# Patient Record
Sex: Male | Born: 1965 | Race: White | Hispanic: No | Smoking: Current every day smoker
Health system: Southern US, Community
[De-identification: ages and names within clinical notes are randomized; demographics above are authoritative.]

## PROBLEM LIST (undated history)

## (undated) HISTORY — PX: VASECTOMY: SHX75

---

## 2019-09-06 ENCOUNTER — Emergency Department
Admission: EM | Admit: 2019-09-06 | Discharge: 2019-09-06 | Disposition: A | Discharge status: Home / Self Care | Payer: Self-pay | Source: Home / Self Care

## 2019-09-06 ENCOUNTER — Other Ambulatory Visit: Payer: Self-pay

## 2019-09-06 ENCOUNTER — Encounter: Payer: Self-pay | Admitting: Emergency Medicine

## 2019-09-06 ENCOUNTER — Emergency Department (INDEPENDENT_AMBULATORY_CARE_PROVIDER_SITE_OTHER): Payer: Self-pay

## 2019-09-06 DIAGNOSIS — M898X1 Other specified disorders of bone, shoulder: Secondary | ICD-10-CM

## 2019-09-06 DIAGNOSIS — G8929 Other chronic pain: Secondary | ICD-10-CM

## 2019-09-06 DIAGNOSIS — M549 Dorsalgia, unspecified: Secondary | ICD-10-CM

## 2019-09-06 DIAGNOSIS — M25512 Pain in left shoulder: Secondary | ICD-10-CM

## 2019-09-06 NOTE — ED Triage Notes (Signed)
LT shoulder pain x 6 months, painful, denies injury, constant ache

## 2019-09-06 NOTE — ED Provider Notes (Signed)
[facility]    CSN: [id number] Arrival date & time: 09/06/19  4132      History   Chief Complaint Chief Complaint  Patient presents with  . Shoulder Pain    HPI Shane Dyer is a 53 y.o. male.   HPI  Shane Dyer is a 53 y.o. male presenting to UC with c/o gradually worsening pain in Left upper back, around shoulder blade for about 6 months. 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 Pain is aching and sore, sharp at times. Worse with movement.  He notes he did go to an urgent care and get muscle relaxers and pain medication when he initially had pain but no improvement since then.     History reviewed. No pertinent past medical history.  There are no active problems to display for this patient.   Past Surgical History:  Procedure Laterality Date  . VASECTOMY         Home Medications    Prior to Admission medications   Medication Sig Start Date End Date Taking? Authorizing Provider  acetaminophen (TYLENOL) 500 MG tablet Take 500 mg by mouth every 6 (six) hours as needed.   Yes [provider]  naproxen sodium (ALEVE) 220 MG tablet Take 220 mg by mouth.   Yes [provider]    Family History Family History  Problem Relation Age of Onset  . Heart attack Mother   . Emphysema Mother   . Emphysema Father     Social History Social History   Tobacco Use  . Smoking status: Current Every Day Smoker    Years: 40.00    Types: Cigarettes  . Smokeless tobacco: Never Used  Substance Use Topics  . Alcohol use: Yes  . Drug use: Not on file     Allergies   Penicillins   Review of Systems Review of Systems  Musculoskeletal: Positive for arthralgias, back pain and myalgias. Negative for neck pain and neck stiffness.  Skin: Negative for color change and wound.  Neurological: Negative for weakness and numbness.     Physical Exam Triage Vital Signs ED Triage Vitals  Enc Vitals Group     BP  09/06/19 0959 136/90     Pulse Rate 09/06/19 0959 (!) 110     Resp --      Temp 09/06/19 0959 97.9 F (36.6 C)     Temp Source 09/06/19 0959 Oral     SpO2 09/06/19 0959 98 %     Weight 09/06/19 1000 145 lb (65.8 kg)     Height 09/06/19 1000 6\' 2"  (1.88 m)     Head Circumference --      Peak Flow --      Pain Score 09/06/19 1000 9     Pain Loc --      Pain Edu? --      Excl. in Index? --    No data found.  Updated Vital Signs BP 136/90 (BP Location: Right Arm)   Pulse (!) 110   Temp 97.9 F (36.6 C) (Oral)   Ht 6\' 2"  (1.88 m)   Wt 145 lb (65.8 kg)   SpO2 98%   BMI 18.62 kg/m   Visual Acuity Right Eye Distance:   Left Eye Distance:   Bilateral Distance:    Right Eye Near:   Left Eye Near:    Bilateral Near:     Physical Exam Vitals signs and nursing note reviewed.  Constitutional:  Appearance: Normal appearance. He is well-developed.  HENT:     Head: Normocephalic and atraumatic.  Neck:     Musculoskeletal: Normal range of motion.  Cardiovascular:     Rate and Rhythm: Normal rate.     Pulses:          Radial pulses are 2+ on the left side.  Pulmonary:     Effort: Pulmonary effort is normal.  Musculoskeletal: Normal range of motion.        General: Tenderness present.       Back:  Skin:    General: Skin is warm and dry.     Capillary Refill: Capillary refill takes less than 2 seconds.  Neurological:     Mental Status: He is alert and oriented to person, place, and time.     Sensory: No sensory deficit.  Psychiatric:        Behavior: Behavior normal.      UC Treatments / Results  Labs (all labs ordered are listed, but only abnormal results are displayed) Labs Reviewed - No data to display  EKG   Radiology Dg Thoracic Spine W/swimmers  Result Date: 09/06/2019 CLINICAL DATA:  53 year old male with chronic left shoulder pain. EXAM: THORACIC SPINE - 3 VIEWS COMPARISON:  None. FINDINGS: There is no acute fracture or subluxation of the thoracic  spine. The vertebral body heights and disc spaces are maintained. The visualized posterior elements are intact. The soft tissues are unremarkable. IMPRESSION: Negative. Electronically Signed   By: Elgie Collard M.D.   On: 09/06/2019 10:36    Procedures Procedures (including critical care time)  Medications Ordered in UC Medications - No data to display  Initial Impression / Assessment and Plan / UC Course  I have reviewed the triage vital signs and the nursing notes.  Pertinent labs & imaging results that were available during my care of the patient were reviewed by me and considered in my medical decision making (see chart for details).     Tenderness along Left upper thoracic paraspinal muscles and along Left scapula No deformity noted. Full ROM Left shoulder w/o crepitus Reviewed imaging normal Encouraged f/u with specialist as he will likely benefit from more detailed imaging such as an MRI and/or referral to PT given chronicity of pain Pt declined muscle relaxers and pain medication.    Final Clinical Impressions(s) / UC Diagnoses   Final diagnoses:  Chronic left shoulder pain  Pain of left scapula  Mid back pain on left side     Discharge Instructions      It is recommended you schedule an appointment with Sports Medicine or an Orthopedist and request additional imaging such as an MRI or you may need referral to physical therapy due to the chronic nature of your pain and normal x-rays.     ED Prescriptions    None     I have reviewed the PDMP during this encounter.   Lurene Shadow, [place] 09/06/19 1103

## 2019-09-06 NOTE — Discharge Instructions (Signed)
°  It is recommended you schedule an appointment with Sports Medicine or an Orthopedist and request additional imaging such as an MRI or you may need referral to physical therapy due to the chronic nature of your pain and normal x-rays.

## 2019-11-19 DEATH — deceased

## 2021-03-01 IMAGING — DX DG THORACIC SPINE 3V
4 series · 4 of 4 positions shown · non-contrast
Comparison: None.

CLINICAL DATA: 53-year-old male with chronic left shoulder pain.

EXAM:
THORACIC SPINE - 3 VIEWS

[t-spine ap]
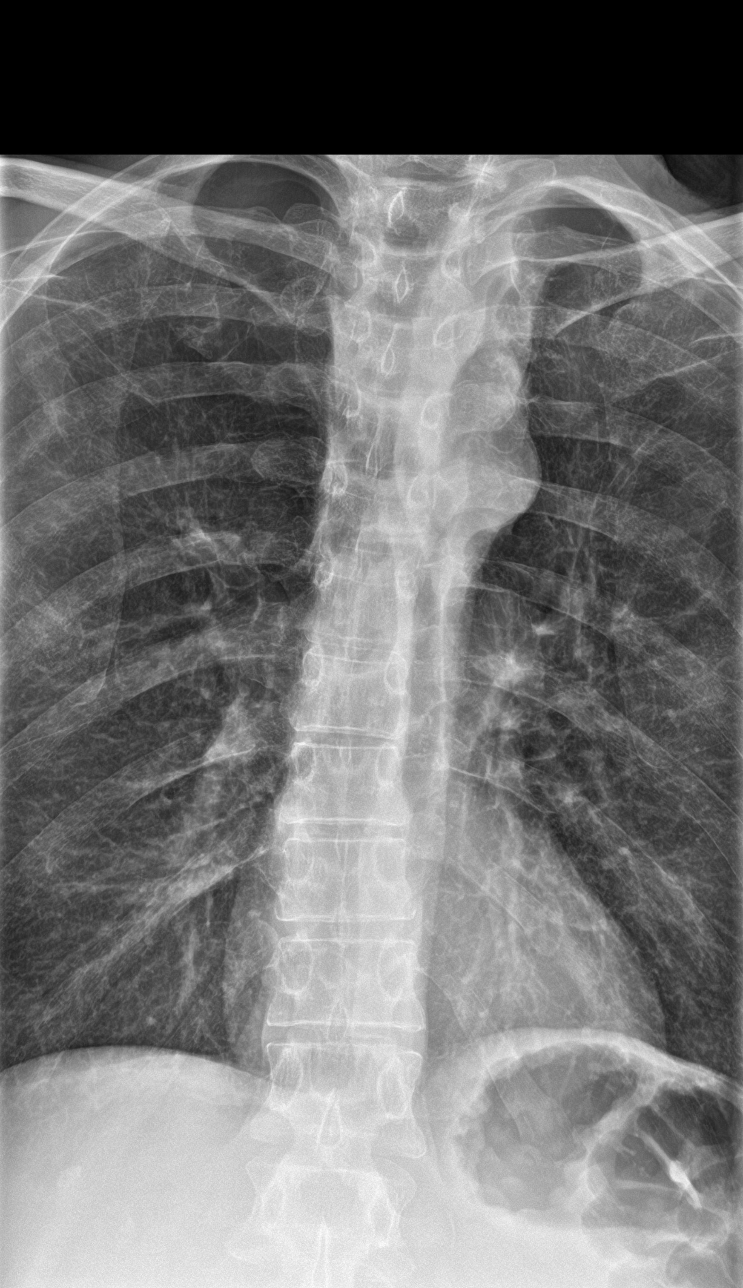

[t-spine lat]
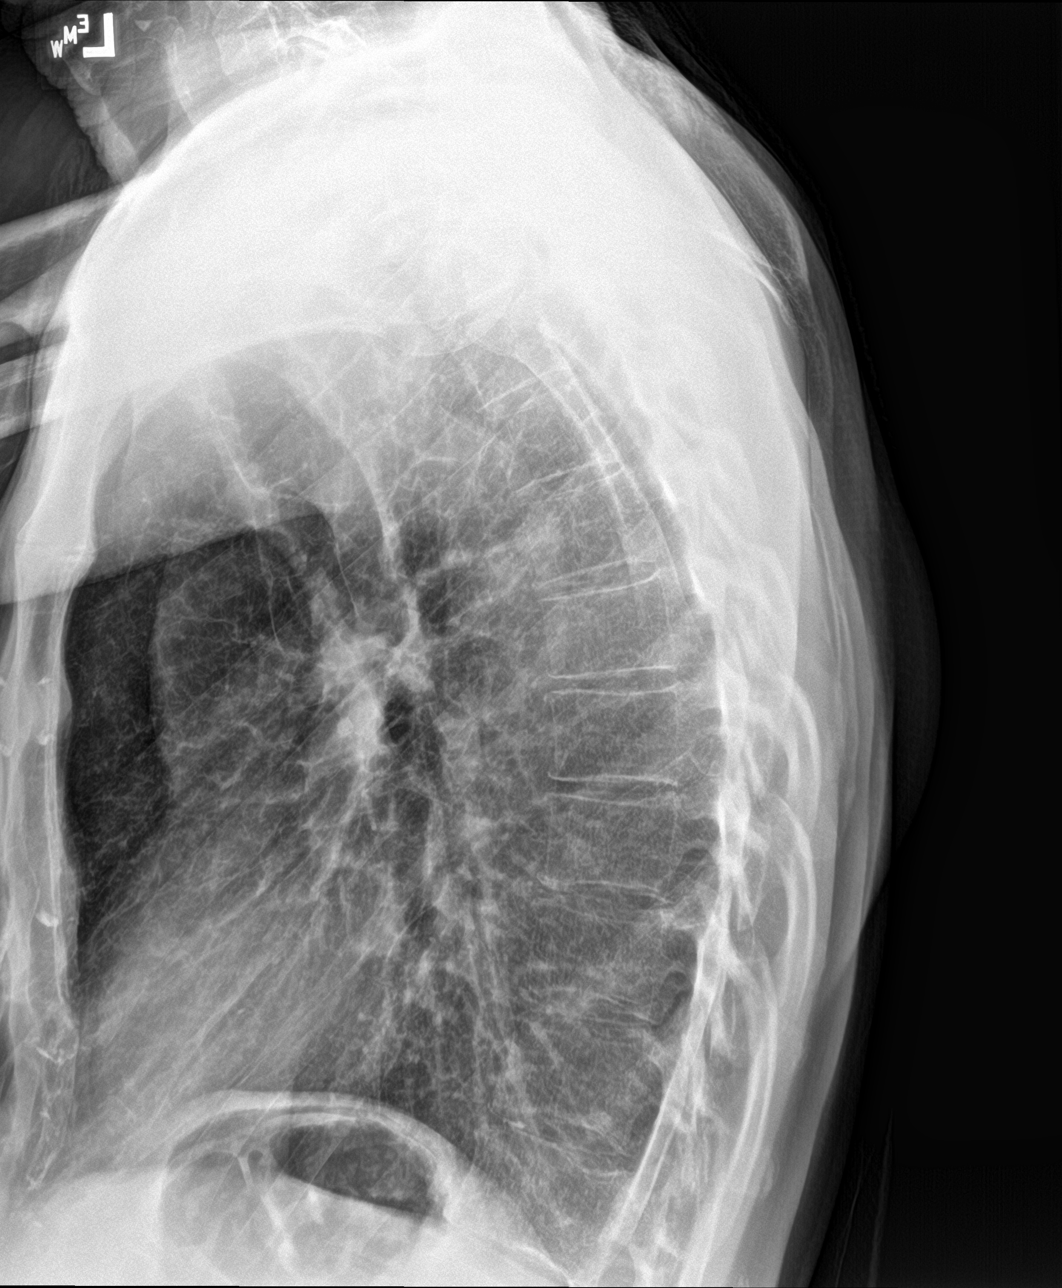

[t-spine swimmers (1 of 2)]
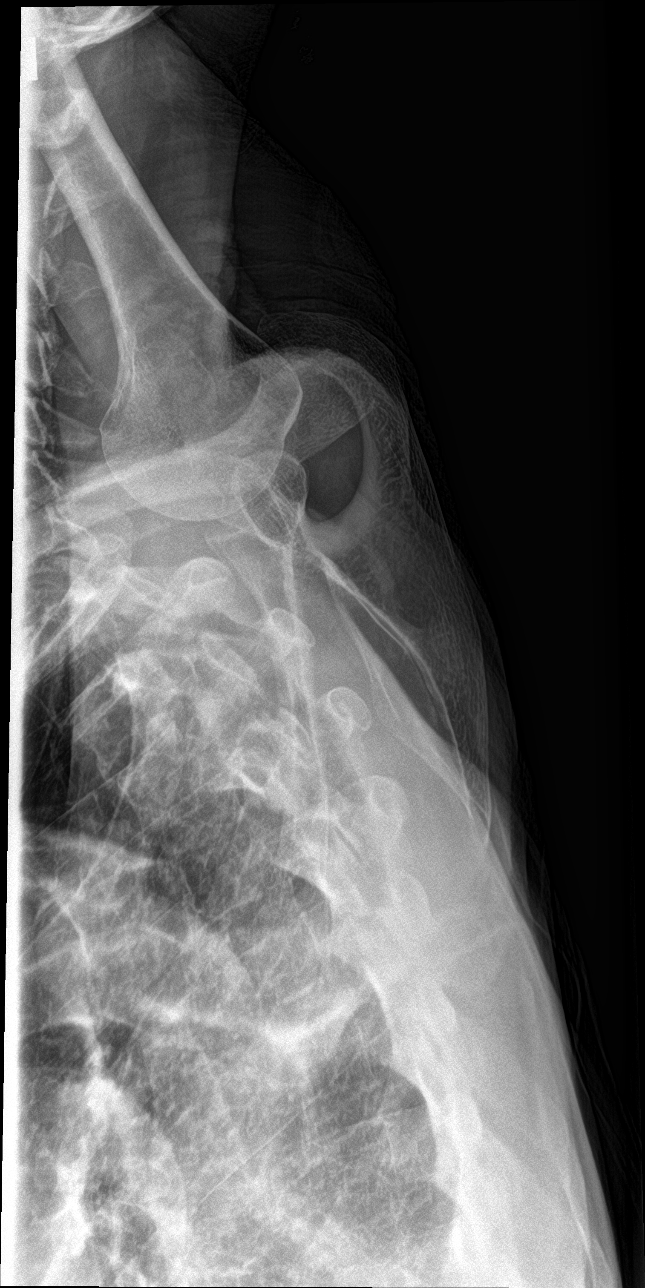

[t-spine swimmers (2 of 2)]
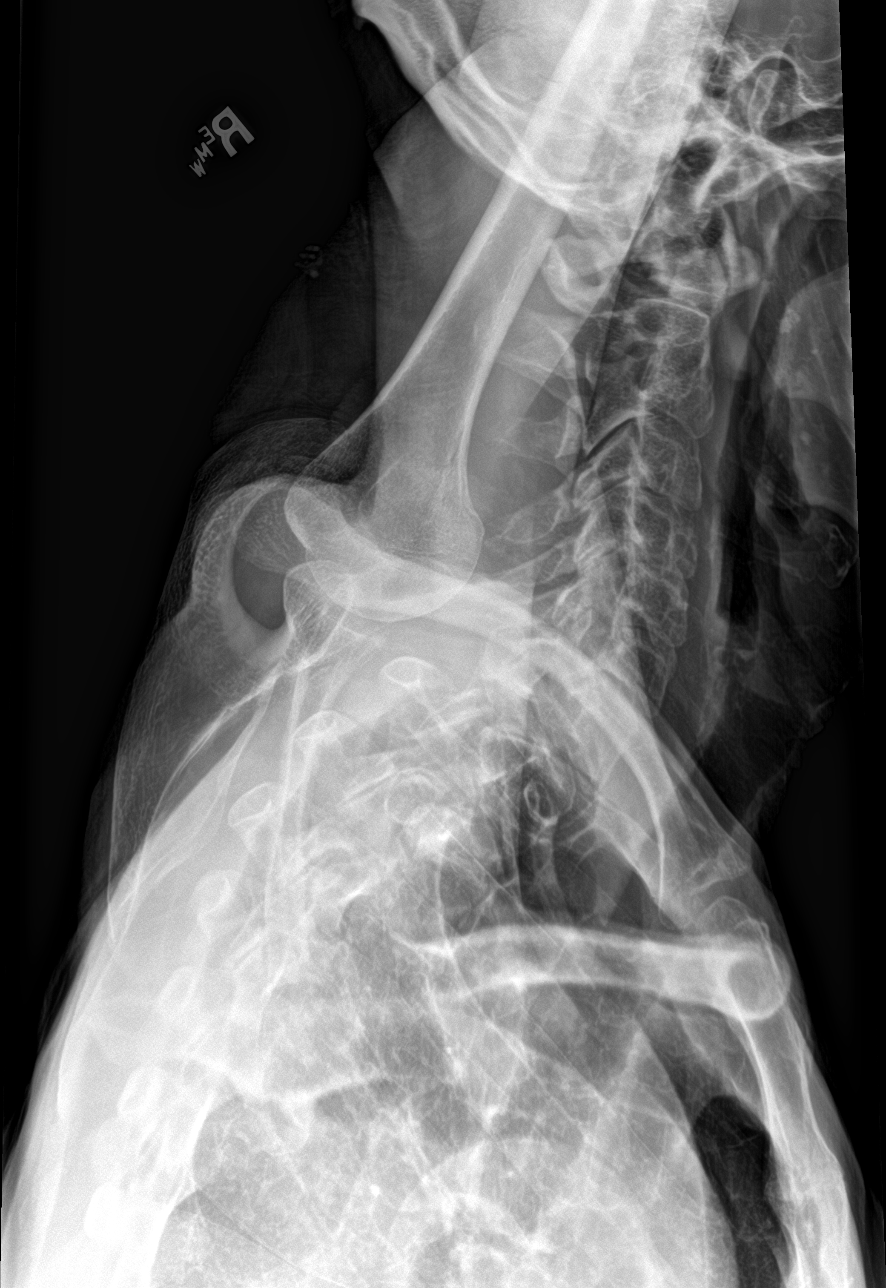

[4 of 4 positions shown; findings below may reference images not displayed]

FINDINGS: There is no acute fracture or subluxation of the thoracic spine. The
vertebral body heights and disc spaces are maintained. The
visualized posterior elements are intact. The soft tissues are
unremarkable.
IMPRESSION: Negative.
